# Patient Record
Sex: Female | Born: 1985 | Race: White | Hispanic: No | Marital: Single | Smoking: Current every day smoker
Health system: Southern US, Community
[De-identification: ages and names within clinical notes are randomized; demographics above are authoritative.]

## PROBLEM LIST (undated history)

## (undated) DIAGNOSIS — F329 Major depressive disorder, single episode, unspecified: Secondary | ICD-10-CM

## (undated) DIAGNOSIS — F419 Anxiety disorder, unspecified: Secondary | ICD-10-CM

## (undated) DIAGNOSIS — I10 Essential (primary) hypertension: Secondary | ICD-10-CM

## (undated) DIAGNOSIS — F32A Depression, unspecified: Secondary | ICD-10-CM

## (undated) HISTORY — PX: KNEE SURGERY: SHX244

## (undated) HISTORY — PX: CHOLECYSTECTOMY: SHX55

---

## 2016-09-11 ENCOUNTER — Encounter (HOSPITAL_BASED_OUTPATIENT_CLINIC_OR_DEPARTMENT_OTHER): Payer: Self-pay | Admitting: *Deleted

## 2016-09-11 ENCOUNTER — Emergency Department (HOSPITAL_BASED_OUTPATIENT_CLINIC_OR_DEPARTMENT_OTHER)
Admission: EM | Admit: 2016-09-11 | Discharge: 2016-09-11 | Disposition: A | Discharge status: Home / Self Care | Payer: Medicaid Other | Attending: Emergency Medicine | Admitting: Emergency Medicine

## 2016-09-11 ENCOUNTER — Emergency Department (HOSPITAL_BASED_OUTPATIENT_CLINIC_OR_DEPARTMENT_OTHER): Payer: Medicaid Other

## 2016-09-11 DIAGNOSIS — F17210 Nicotine dependence, cigarettes, uncomplicated: Secondary | ICD-10-CM | POA: Diagnosis not present

## 2016-09-11 DIAGNOSIS — N8302 Follicular cyst of left ovary: Secondary | ICD-10-CM | POA: Diagnosis not present

## 2016-09-11 DIAGNOSIS — R1032 Left lower quadrant pain: Secondary | ICD-10-CM | POA: Insufficient documentation

## 2016-09-11 DIAGNOSIS — R110 Nausea: Secondary | ICD-10-CM | POA: Diagnosis not present

## 2016-09-11 DIAGNOSIS — N83202 Unspecified ovarian cyst, left side: Secondary | ICD-10-CM

## 2016-09-11 DIAGNOSIS — I10 Essential (primary) hypertension: Secondary | ICD-10-CM | POA: Insufficient documentation

## 2016-09-11 DIAGNOSIS — R102 Pelvic and perineal pain: Secondary | ICD-10-CM | POA: Diagnosis present

## 2016-09-11 HISTORY — DX: Essential (primary) hypertension: I10

## 2016-09-11 HISTORY — DX: Anxiety disorder, unspecified: F41.9

## 2016-09-11 HISTORY — DX: Depression, unspecified: F32.A

## 2016-09-11 HISTORY — DX: Major depressive disorder, single episode, unspecified: F32.9

## 2016-09-11 LAB — CBC WITH DIFFERENTIAL/PLATELET
Basophils Absolute: 0 10*3/uL (ref 0.0–0.1)
Basophils Relative: 0 %
EOS PCT: 1 %
Eosinophils Absolute: 0.2 10*3/uL (ref 0.0–0.7)
HCT: 38.5 % (ref 36.0–46.0)
Hemoglobin: 12.2 g/dL (ref 12.0–15.0)
LYMPHS ABS: 2.7 10*3/uL (ref 0.7–4.0)
LYMPHS PCT: 22 %
MCH: 30.3 pg (ref 26.0–34.0)
MCHC: 31.7 g/dL (ref 30.0–36.0)
MCV: 95.8 fL (ref 78.0–100.0)
MONO ABS: 1 10*3/uL (ref 0.1–1.0)
MONOS PCT: 8 %
Neutro Abs: 8.3 10*3/uL — ABNORMAL HIGH (ref 1.7–7.7)
Neutrophils Relative %: 69 %
PLATELETS: 275 10*3/uL (ref 150–400)
RBC: 4.02 MIL/uL (ref 3.87–5.11)
RDW: 18.9 % — AB (ref 11.5–15.5)
WBC: 12.2 10*3/uL — ABNORMAL HIGH (ref 4.0–10.5)

## 2016-09-11 LAB — WET PREP, GENITAL
Clue Cells Wet Prep HPF POC: NONE SEEN
Sperm: NONE SEEN
Trich, Wet Prep: NONE SEEN
YEAST WET PREP: NONE SEEN

## 2016-09-11 LAB — BASIC METABOLIC PANEL
ANION GAP: 8 (ref 5–15)
BUN: 11 mg/dL (ref 6–20)
CALCIUM: 8.9 mg/dL (ref 8.9–10.3)
CO2: 27 mmol/L (ref 22–32)
Chloride: 101 mmol/L (ref 101–111)
Creatinine, Ser: 0.82 mg/dL (ref 0.44–1.00)
GFR calc Af Amer: >60 mL/min (ref >60)
GLUCOSE: 107 mg/dL — AB (ref 65–99)
Potassium: 4 mmol/L (ref 3.5–5.1)
Sodium: 136 mmol/L (ref 135–145)

## 2016-09-11 LAB — URINALYSIS, ROUTINE W REFLEX MICROSCOPIC
GLUCOSE, UA: NEGATIVE mg/dL
HGB URINE DIPSTICK: NEGATIVE
Ketones, ur: 15 mg/dL — AB
Leukocytes, UA: NEGATIVE
Nitrite: NEGATIVE
Protein, ur: NEGATIVE mg/dL
SPECIFIC GRAVITY, URINE: 1.025 (ref 1.005–1.030)
pH: 6 (ref 5.0–8.0)

## 2016-09-11 LAB — PREGNANCY, URINE: PREG TEST UR: NEGATIVE

## 2016-09-11 MED ORDER — NAPROXEN 500 MG PO TABS: 500.0000 mg | ORAL_TABLET | Freq: Two times a day (BID) | ORAL | 0 refills | Status: AC

## 2016-09-11 MED ORDER — MORPHINE SULFATE (PF) 4 MG/ML IV SOLN
4.0000 mg | Freq: Once | INTRAVENOUS | Status: AC
  Administered 2016-09-11: 4 mg via INTRAVENOUS
  Filled 2016-09-11: qty 1

## 2016-09-11 MED ORDER — IOPAMIDOL (ISOVUE-300) INJECTION 61%
100.0000 mL | Freq: Once | INTRAVENOUS | Status: AC | PRN
Start: 2016-09-11 — End: 2016-09-11
  Administered 2016-09-11: 100 mL via INTRAVENOUS

## 2016-09-11 MED ORDER — SODIUM CHLORIDE 0.9 % IV SOLN
INTRAVENOUS | Status: DC
  Administered 2016-09-11: 17:00:00 via INTRAVENOUS

## 2016-09-11 NOTE — ED Notes (Signed)
ED Provider at bedside. 

## 2016-09-11 NOTE — ED Provider Notes (Signed)
[facility] Provider Note   CSN: [id number] Arrival date & time: 09/11/16  1507  By signing my name below, I, Amy Walker, attest that this documentation has been prepared under the direction and in the presence of Linwood Dibbles, MD. Electronically Signed: Karren Cobble, ED Scribe. 09/11/16. 4:56 PM.  History   Chief Complaint Chief Complaint  Patient presents with  . Pelvic Pain    The history is provided by the patient. No language interpreter was used.  Pelvic Pain  This is a new problem. The current episode started more than 2 days ago. The problem occurs constantly. The problem has been gradually worsening. Associated symptoms include abdominal pain. She has tried nothing for the symptoms.    HPI HPI Comments: Amy Walker is a 31 y.o. female who presents to the Emergency Department complaining of sudden onset, gradually worsening, persistent pelvic pain that began three days ago. She notes associated LLQ abdominal cramping and one episode of nausea today. Pt reports for the past three days she has been experiencing cramping to her LLQ. She reports having her LMP on 7/10, which was normal, and she has had NBM for the last two days. She endorses a PSHx of cholecystectomy. No treatment tried PTA. Denies fever, vomiting, diarrhea, vaginal bleeding or discharge, or dysuria.   Past Medical History:  Diagnosis Date  . Anxiety   . Depression   . Hypertension    There are no active problems to display for this patient.  Past Surgical History:  Procedure Laterality Date  . CESAREAN SECTION    . CHOLECYSTECTOMY    . KNEE SURGERY Bilateral    OB History    No data available     Home Medications    Prior to Admission medications   Medication Sig Start Date End Date Taking? Authorizing Provider  naproxen (NAPROSYN) 500 MG tablet Take 1 tablet (500 mg total) by mouth 2 (two) times daily with a meal. As needed for pain 09/11/16   Linwood Dibbles, MD   Family History No family  history on file.  Social History Social History  Substance Use Topics  . Smoking status: Current Every Day Smoker    Types: Cigars  . Smokeless tobacco: Never Used  . Alcohol use Yes     Comment: occ   Allergies   Patient has no known allergies.  Review of Systems Review of Systems  Constitutional: Negative for fever.  Gastrointestinal: Positive for abdominal pain and nausea. Negative for diarrhea and vomiting.  Genitourinary: Positive for pelvic pain. Negative for dysuria, vaginal bleeding and vaginal discharge.   Physical Exam Updated Vital Signs BP (!) 144/75   Pulse 86   Temp 98.2 F (36.8 C) (Oral)   Resp 16   Ht 1.651 m (5\' 5" )   Wt 93.9 kg (207 lb)   LMP 08/23/2016   SpO2 92%   BMI 34.45 kg/m   Physical Exam  Constitutional: She appears well-developed and well-nourished. No distress.  HENT:  Head: Normocephalic and atraumatic.  Right Ear: External ear normal.  Left Ear: External ear normal.  Eyes: Conjunctivae are normal. Right eye exhibits no discharge. Left eye exhibits no discharge. No scleral icterus.  Neck: Neck supple. No tracheal deviation present.  Cardiovascular: Normal rate, regular rhythm and intact distal pulses.   Pulmonary/Chest: Effort normal and breath sounds normal. No stridor. No respiratory distress. She has no wheezes. She has no rales.  Abdominal: Soft. Bowel sounds are normal. She exhibits no distension. There is tenderness  in the left lower quadrant. There is guarding. There is no rigidity and no rebound. No hernia.  Genitourinary: Vagina normal and uterus normal. Pelvic exam was performed with patient supine. There is no rash, tenderness or lesion on the right labia. There is no rash, tenderness or lesion on the left labia. Cervix exhibits no motion tenderness and no discharge. Right adnexum displays no mass, no tenderness and no fullness. Left adnexum displays tenderness. Left adnexum displays no mass and no fullness.  Musculoskeletal: She  exhibits no edema or tenderness.  Neurological: She is alert. She has normal strength. No cranial nerve deficit (no facial droop, extraocular movements intact, no slurred speech) or sensory deficit. She exhibits normal muscle tone. She displays no seizure activity. Coordination normal.  Skin: Skin is warm and dry. No rash noted.  Psychiatric: She has a normal mood and affect.  Nursing note and vitals reviewed.  ED Treatments / Results  DIAGNOSTIC STUDIES: Oxygen Saturation is 97% on RA, adequate by my interpretation.   COORDINATION OF CARE: 4:53 PM-Discussed next steps with pt. Pt verbalized understanding and is agreeable with the plan.   Labs (all labs ordered are listed, but only abnormal results are displayed) Labs Reviewed  WET PREP, GENITAL - Abnormal; Notable for the following:       Result Value   WBC, Wet Prep HPF POC FEW (*)    All other components within normal limits  URINALYSIS, ROUTINE W REFLEX MICROSCOPIC - Abnormal; Notable for the following:    APPearance CLOUDY (*)    Bilirubin Urine SMALL (*)    Ketones, ur 15 (*)    All other components within normal limits  CBC WITH DIFFERENTIAL/PLATELET - Abnormal; Notable for the following:    WBC 12.2 (*)    RDW 18.9 (*)    Neutro Abs 8.3 (*)    All other components within normal limits  BASIC METABOLIC PANEL - Abnormal; Notable for the following:    Glucose, Bld 107 (*)    All other components within normal limits  PREGNANCY, URINE  RPR  HIV ANTIBODY (ROUTINE TESTING)  GC/CHLAMYDIA PROBE AMP ([place]) NOT AT [facility]    Radiology US Transvaginal Non-ob  Result Date: 09/11/2016 CLINICAL DATA:  Left lower quadrant pain for 3 days. EXAM: TRANSABDOMINAL AND TRANSVAGINAL ULTRASOUND OF PELVIS TECHNIQUE: Both transabdominal and transvaginal ultrasound examinations of the pelvis were performed. Transabdominal technique was performed for global imaging of the pelvis including uterus, ovaries, adnexal regions, and pelvic  cul-de-sac. It was necessary to proceed with endovaginal exam following the transabdominal exam to visualize the endometrium and ovaries. COMPARISON:  None FINDINGS: Uterus Measurements: 9.3 x 5.3 x 6.1 cm. Small nabothian cysts in the cervix. Endometrium Thickness: 14 mm.  Mildly heterogeneous with no focal mass. Right ovary Not visualized. Left ovary Not visualized. Other findings No abnormal free fluid. IMPRESSION: 1. Neither ovary was visualized. No cause for the left lower quadrant pain identified. Electronically Signed   By: Gerome Sam III M.D   On: 09/11/2016 18:33   US Pelvis Complete  Result Date: 09/11/2016 CLINICAL DATA:  Left lower quadrant pain for 3 days. EXAM: TRANSABDOMINAL AND TRANSVAGINAL ULTRASOUND OF PELVIS TECHNIQUE: Both transabdominal and transvaginal ultrasound examinations of the pelvis were performed. Transabdominal technique was performed for global imaging of the pelvis including uterus, ovaries, adnexal regions, and pelvic cul-de-sac. It was necessary to proceed with endovaginal exam following the transabdominal exam to visualize the endometrium and ovaries. COMPARISON:  None FINDINGS: Uterus Measurements: 9.3 x  5.3 x 6.1 cm. Small nabothian cysts in the cervix. Endometrium Thickness: 14 mm.  Mildly heterogeneous with no focal mass. Right ovary Not visualized. Left ovary Not visualized. Other findings No abnormal free fluid. IMPRESSION: 1. Neither ovary was visualized. No cause for the left lower quadrant pain identified. Electronically Signed   By: Gerome Samavid  Williams III M.D   On: 09/11/2016 18:33   Ct Abdomen Pelvis W Contrast  Result Date: 09/11/2016 CLINICAL DATA:  31 year old female with left lower quadrant abdominal pain. EXAM: CT ABDOMEN AND PELVIS WITH CONTRAST TECHNIQUE: Multidetector CT imaging of the abdomen and pelvis was performed using the standard protocol following bolus administration of intravenous contrast. CONTRAST:  100mL ISOVUE-300 IOPAMIDOL (ISOVUE-300)  INJECTION 61% COMPARISON:  None. FINDINGS: Lower chest: Right lung base scarring noted. The visualized lung bases are otherwise clear. No intra-abdominal free air or free fluid. Hepatobiliary: There is diffuse fatty infiltration of the liver. No intrahepatic biliary ductal dilatation. Cholecystectomy. Pancreas: Unremarkable. No pancreatic ductal dilatation or surrounding inflammatory changes. Spleen: Normal in size without focal abnormality. Adrenals/Urinary Tract: There is a 7 mm indeterminate left adrenal nodule, likely an adenoma. The right adrenal gland is unremarkable. There multiple nonobstructing bilateral renal calculi measuring up to 4 mm in the superior pole of the left kidney. Punctate nonobstructing stone fragments may be present in the region of the ureteropelvic junctions bilaterally. There is no hydronephrosis on either side. The urinary bladder is only partially distended and appears grossly unremarkable. Stomach/Bowel: There is no evidence of bowel obstruction or active inflammation. Normal appendix Vascular/Lymphatic: No significant vascular findings are present. No enlarged abdominal or pelvic lymph nodes. Reproductive: The uterus is anteverted and grossly unremarkable. There is a 2.5 cm left ovarian dominant follicle or cyst. There is mild haziness of the fat adjacent to the left ovary which may represent mild inflammation. Ultrasound may provide better evaluation of the pelvic structures. The right ovary is unremarkable. Other: Small fat containing umbilical hernia. Musculoskeletal: No acute or significant osseous findings. IMPRESSION: 1. A 2.5 cm left ovarian dominant follicle/cyst. Ultrasound may provide better evaluation of the pelvic structures. 2. Multiple nonobstructing bilateral renal calculi. No hydronephrosis. 3. Fatty liver. 4. No bowel obstruction or active inflammation.  Normal appendix. Electronically Signed   By: Elgie CollardArash  Radparvar M.D.   On: 09/11/2016 20:36     Procedures Procedures (including critical care time)  Medications Ordered in ED Medications  0.9 %  sodium chloride infusion ( Intravenous New Bag/Given 09/11/16 1659)  morphine 4 MG/ML injection 4 mg (4 mg Intravenous Given 09/11/16 1701)  morphine 4 MG/ML injection 4 mg (4 mg Intravenous Given 09/11/16 1932)  iopamidol (ISOVUE-300) 61 % injection 100 mL (100 mLs Intravenous Contrast Given 09/11/16 1938)    Initial Impression / Assessment and Plan / ED Course  I have reviewed the triage vital signs and the nursing notes.  Pertinent labs & imaging results that were available during my care of the patient were reviewed by me and considered in my medical decision making (see chart for details).  Clinical Course as of Sep 11 2112  Sun Sep 11, 2016  1746 Left adnexal ttp on pelvic exam.   Will us to evaluate for torsion , mass , cyst  [JK]  1907 Ovaries not visualized on us.  Will ct to evaluate further  [JK]    Clinical Course User Index [JK] Linwood DibblesKnapp, Julene Rahn, MD    CT scan demonstrates a l ovarian cyst not noted on US.  Pt sx improved with  treatment.  I doubt torsion at this point considering that her pain has resolved and she is more comfortable. Unfortunately us was not able to visualize the ovaries and confirm flow.  Will dc home on nsaids.  Discussed outpatient follow up with ob gyn  Final Clinical Impressions(s) / ED Diagnoses   Final diagnoses:  Cyst of left ovary    New Prescriptions New Prescriptions   NAPROXEN (NAPROSYN) 500 MG TABLET    Take 1 tablet (500 mg total) by mouth 2 (two) times daily with a meal. As needed for pain  I personally performed the services described in this documentation, which was scribed in my presence.  The recorded information has been reviewed and is accurate.    Linwood DibblesKnapp, Jesiah Yerby, MD 09/11/16 2116

## 2016-09-11 NOTE — ED Triage Notes (Signed)
Pt reports pelvic pain x3days. Denies fever, abnormal vaginal discharge/bleeding. Denies urinary symptoms, v/d. Reports mild nausea.

## 2016-09-11 NOTE — ED Notes (Signed)
Alert, NAD, calm, using phone, VSS.

## 2016-09-11 NOTE — ED Notes (Signed)
EDP into see 

## 2016-09-11 NOTE — ED Notes (Signed)
Alert, NAD, calm, interactive, resps e/u, speaking in clear complete sentences, no dyspnea noted, skin W&D, VSS, BP elevated, (denies: sob, nausea, dizziness or visual changes). To CT by stretcher.

## 2016-09-11 NOTE — ED Notes (Signed)
Back from CT, no changes.  

## 2016-09-12 LAB — GC/CHLAMYDIA PROBE AMP (~~LOC~~) NOT AT ARMC
Chlamydia: NEGATIVE
NEISSERIA GONORRHEA: NEGATIVE

## 2016-09-13 LAB — RPR: RPR Ser Ql: NONREACTIVE

## 2016-09-13 LAB — HIV ANTIBODY (ROUTINE TESTING W REFLEX): HIV Screen 4th Generation wRfx: NONREACTIVE

## 2020-08-05 ENCOUNTER — Emergency Department (HOSPITAL_BASED_OUTPATIENT_CLINIC_OR_DEPARTMENT_OTHER): Payer: Self-pay

## 2020-08-05 ENCOUNTER — Emergency Department (HOSPITAL_BASED_OUTPATIENT_CLINIC_OR_DEPARTMENT_OTHER)
Admission: EM | Admit: 2020-08-05 | Discharge: 2020-08-06 | Disposition: A | Discharge status: Home / Self Care | Payer: Self-pay | Attending: Emergency Medicine | Admitting: Emergency Medicine

## 2020-08-05 ENCOUNTER — Other Ambulatory Visit: Payer: Self-pay

## 2020-08-05 ENCOUNTER — Encounter (HOSPITAL_BASED_OUTPATIENT_CLINIC_OR_DEPARTMENT_OTHER): Payer: Self-pay

## 2020-08-05 DIAGNOSIS — R0789 Other chest pain: Secondary | ICD-10-CM | POA: Insufficient documentation

## 2020-08-05 DIAGNOSIS — R1084 Generalized abdominal pain: Secondary | ICD-10-CM | POA: Insufficient documentation

## 2020-08-05 DIAGNOSIS — I10 Essential (primary) hypertension: Secondary | ICD-10-CM | POA: Insufficient documentation

## 2020-08-05 DIAGNOSIS — R1013 Epigastric pain: Secondary | ICD-10-CM | POA: Insufficient documentation

## 2020-08-05 DIAGNOSIS — R112 Nausea with vomiting, unspecified: Secondary | ICD-10-CM | POA: Insufficient documentation

## 2020-08-05 DIAGNOSIS — F17210 Nicotine dependence, cigarettes, uncomplicated: Secondary | ICD-10-CM | POA: Insufficient documentation

## 2020-08-05 DIAGNOSIS — N10 Acute tubulo-interstitial nephritis: Secondary | ICD-10-CM

## 2020-08-05 LAB — URINALYSIS, ROUTINE W REFLEX MICROSCOPIC
Bilirubin Urine: NEGATIVE
Glucose, UA: NEGATIVE mg/dL
Hgb urine dipstick: NEGATIVE
Ketones, ur: 15 mg/dL — AB
Leukocytes,Ua: NEGATIVE
Nitrite: POSITIVE — AB
Protein, ur: NEGATIVE mg/dL
Specific Gravity, Urine: 1.015 (ref 1.005–1.030)
pH: 8 (ref 5.0–8.0)

## 2020-08-05 LAB — COMPREHENSIVE METABOLIC PANEL
ALT: 14 U/L (ref 0–44)
AST: 15 U/L (ref 15–41)
Albumin: 3.8 g/dL (ref 3.5–5.0)
Alkaline Phosphatase: 68 U/L (ref 38–126)
Anion gap: 9 (ref 5–15)
BUN: 14 mg/dL (ref 6–20)
CO2: 23 mmol/L (ref 22–32)
Calcium: 8.9 mg/dL (ref 8.9–10.3)
Chloride: 104 mmol/L (ref 98–111)
Creatinine, Ser: 0.71 mg/dL (ref 0.44–1.00)
GFR, Estimated: >60 mL/min (ref >60)
Glucose, Bld: 135 mg/dL — ABNORMAL HIGH (ref 70–99)
Potassium: 3.5 mmol/L (ref 3.5–5.1)
Sodium: 136 mmol/L (ref 135–145)
Total Bilirubin: 0.5 mg/dL (ref 0.3–1.2)
Total Protein: 7 g/dL (ref 6.5–8.1)

## 2020-08-05 LAB — CBC WITH DIFFERENTIAL/PLATELET
Abs Immature Granulocytes: 0.09 10*3/uL — ABNORMAL HIGH (ref 0.00–0.07)
Basophils Absolute: 0.1 10*3/uL (ref 0.0–0.1)
Basophils Relative: 0 %
Eosinophils Absolute: 0 10*3/uL (ref 0.0–0.5)
Eosinophils Relative: 0 %
HCT: 36 % (ref 36.0–46.0)
Hemoglobin: 11.1 g/dL — ABNORMAL LOW (ref 12.0–15.0)
Immature Granulocytes: 1 %
Lymphocytes Relative: 7 %
Lymphs Abs: 1.1 10*3/uL (ref 0.7–4.0)
MCH: 25.3 pg — ABNORMAL LOW (ref 26.0–34.0)
MCHC: 30.8 g/dL (ref 30.0–36.0)
MCV: 82 fL (ref 80.0–100.0)
Monocytes Absolute: 0.2 10*3/uL (ref 0.1–1.0)
Monocytes Relative: 2 %
Neutro Abs: 14.6 10*3/uL — ABNORMAL HIGH (ref 1.7–7.7)
Neutrophils Relative %: 90 %
Platelets: 392 10*3/uL (ref 150–400)
RBC: 4.39 MIL/uL (ref 3.87–5.11)
RDW: 20.4 % — ABNORMAL HIGH (ref 11.5–15.5)
WBC: 16.1 10*3/uL — ABNORMAL HIGH (ref 4.0–10.5)
nRBC: 0 % (ref 0.0–0.2)

## 2020-08-05 LAB — RAPID URINE DRUG SCREEN, HOSP PERFORMED
Amphetamines: NOT DETECTED
Barbiturates: NOT DETECTED
Benzodiazepines: NOT DETECTED
Cocaine: POSITIVE — AB
Opiates: POSITIVE — AB
Tetrahydrocannabinol: POSITIVE — AB

## 2020-08-05 LAB — URINALYSIS, MICROSCOPIC (REFLEX)

## 2020-08-05 LAB — HCG, SERUM, QUALITATIVE: Preg, Serum: NEGATIVE

## 2020-08-05 LAB — TROPONIN I (HIGH SENSITIVITY): Troponin I (High Sensitivity): 3 ng/L (ref <18)

## 2020-08-05 MED ORDER — SODIUM CHLORIDE 0.9 % IV BOLUS
1000.0000 mL | Freq: Once | INTRAVENOUS | Status: AC
  Administered 2020-08-05: 1000 mL via INTRAVENOUS

## 2020-08-05 MED ORDER — HALOPERIDOL LACTATE 5 MG/ML IJ SOLN
5.0000 mg | Freq: Once | INTRAMUSCULAR | Status: AC
  Administered 2020-08-05: 5 mg via INTRAVENOUS
  Filled 2020-08-05: qty 1

## 2020-08-05 MED ORDER — ONDANSETRON HCL 4 MG/2ML IJ SOLN
4.0000 mg | Freq: Once | INTRAMUSCULAR | Status: AC
  Administered 2020-08-05: 4 mg via INTRAVENOUS
  Filled 2020-08-05: qty 2

## 2020-08-05 MED ORDER — SODIUM CHLORIDE 0.9 % IV SOLN
1.0000 g | Freq: Once | INTRAVENOUS | Status: AC
  Administered 2020-08-06: 1 g via INTRAVENOUS
  Filled 2020-08-05: qty 10

## 2020-08-05 NOTE — ED Triage Notes (Signed)
Pt c/o CP x 2 hours-n/v x 3 hours-to tx area via w/c-hyperventilating-encouraged to take slow deep breaths

## 2020-08-05 NOTE — ED Provider Notes (Signed)
[facility] EMERGENCY DEPARTMENT Provider Note   CSN: [id number] Arrival date & time: 08/05/20  2030     History Chief Complaint  Patient presents with   Chest Pain   Abdominal Pain    Amy Walker is a 35 y.o. female.  The history is provided by the patient.  Chest Pain Pain location:  Epigastric Pain quality: burning   Pain radiates to:  Does not radiate Pain severity:  Mild Onset quality:  Gradual Timing:  Constant Progression:  Unchanged Chronicity:  New Relieved by:  Nothing Worsened by:  Nothing Associated symptoms: abdominal pain, nausea and vomiting   Associated symptoms: no back pain, no cough, no fever, no palpitations and no shortness of breath   Risk factors: hypertension and smoking (THC use)   Risk factors: no coronary artery disease, no diabetes mellitus, no high cholesterol and no prior DVT/PE       Past Medical History:  Diagnosis Date   Anxiety    Depression    Hypertension     There are no problems to display for this patient.   Past Surgical History:  Procedure Laterality Date   CESAREAN SECTION     CHOLECYSTECTOMY     KNEE SURGERY Bilateral      OB History   No obstetric history on file.     No family history on file.  Social History   Tobacco Use   Smoking status: Every Day    Pack years: 0.00    Types: Cigarettes   Smokeless tobacco: Never  Vaping Use   Vaping Use: Never used  Substance Use Topics   Alcohol use: Yes    Comment: daily   Drug use: Yes    Types: Marijuana    Home Medications Prior to Admission medications   Medication Sig Start Date End Date Taking? Authorizing Provider  naproxen (NAPROSYN) 500 MG tablet Take 1 tablet (500 mg total) by mouth 2 (two) times daily with a meal. As needed for pain 09/11/16   Linwood Dibbles, MD    Allergies    Patient has no known allergies.  Review of Systems   Review of Systems  Constitutional:  Negative for chills and fever.  HENT:  Negative for ear pain and  sore throat.   Eyes:  Negative for pain and visual disturbance.  Respiratory:  Negative for cough and shortness of breath.   Cardiovascular:  Positive for chest pain. Negative for palpitations.  Gastrointestinal:  Positive for abdominal pain, nausea and vomiting.  Genitourinary:  Negative for dysuria and hematuria.  Musculoskeletal:  Negative for arthralgias and back pain.  Skin:  Negative for color change and rash.  Neurological:  Negative for seizures and syncope.  All other systems reviewed and are negative.  Physical Exam Updated Vital Signs  ED Triage Vitals  Enc Vitals Group     BP 08/05/20 2041 (!) 185/77     Pulse Rate 08/05/20 2041 (!) 55     Resp 08/05/20 2041 (!) 28     Temp 08/05/20 2041 97.6 F (36.4 C)     Temp Source 08/05/20 2041 Oral     SpO2 08/05/20 2041 100 %     Weight 08/05/20 2040 200 lb (90.7 kg)     Height 08/05/20 2040 5\' 5"  (1.651 m)     Head Circumference --      Peak Flow --      Pain Score 08/05/20 2038 7     Pain Loc --  Pain Edu? --      Excl. in GC? --      Physical Exam Vitals and nursing note reviewed.  Constitutional:      General: She is in acute distress.     Appearance: She is well-developed. She is not ill-appearing.  HENT:     Head: Normocephalic and atraumatic.     Mouth/Throat:     Mouth: Mucous membranes are moist.  Eyes:     Extraocular Movements: Extraocular movements intact.     Conjunctiva/sclera: Conjunctivae normal.     Pupils: Pupils are equal, round, and reactive to light.  Cardiovascular:     Rate and Rhythm: Normal rate and regular rhythm.     Pulses: Normal pulses.          Radial pulses are 2+ on the right side and 2+ on the left side.     Heart sounds: Normal heart sounds. No murmur heard. Pulmonary:     Effort: Pulmonary effort is normal. No respiratory distress.     Breath sounds: Normal breath sounds. No decreased breath sounds or wheezing.  Abdominal:     Palpations: Abdomen is soft.      Tenderness: There is abdominal tenderness (diffuse).  Musculoskeletal:        General: Normal range of motion.     Cervical back: Normal range of motion and neck supple.     Right lower leg: No edema.     Left lower leg: No edema.  Skin:    General: Skin is warm and dry.     Capillary Refill: Capillary refill takes less than 2 seconds.  Neurological:     General: No focal deficit present.     Mental Status: She is alert.  Psychiatric:        Mood and Affect: Mood normal.    ED Results / Procedures / Treatments   Labs (all labs ordered are listed, but only abnormal results are displayed) Labs Reviewed  CBC WITH DIFFERENTIAL/PLATELET - Abnormal; Notable for the following components:      Result Value   WBC 16.1 (*)    Hemoglobin 11.1 (*)    MCH 25.3 (*)    RDW 20.4 (*)    Neutro Abs 14.6 (*)    Abs Immature Granulocytes 0.09 (*)    All other components within normal limits  COMPREHENSIVE METABOLIC PANEL - Abnormal; Notable for the following components:   Glucose, Bld 135 (*)    All other components within normal limits  HCG, SERUM, QUALITATIVE  URINALYSIS, ROUTINE W REFLEX MICROSCOPIC  TROPONIN I (HIGH SENSITIVITY)    EKG EKG Interpretation  Date/Time:  Wednesday August 05 2020 20:43:00 EDT Ventricular Rate:  54 PR Interval:  137 QRS Duration: 96 QT Interval:  419 QTC Calculation: 397 R Axis:   24 Text Interpretation: Sinus rhythm Confirmed by Virgina Norfolkuratolo, Andilynn Delavega ([id number]) on 08/05/2020 8:46:28 PM  Radiology CT ABDOMEN PELVIS WO CONTRAST  Result Date: 08/05/2020 CLINICAL DATA:  Nausea and vomiting for several hours with abdominal pain, initial encounter EXAM: CT ABDOMEN AND PELVIS WITHOUT CONTRAST TECHNIQUE: Multidetector CT imaging of the abdomen and pelvis was performed following the standard protocol without IV contrast. COMPARISON:  09/11/2016 FINDINGS: Lower chest: No acute abnormality. Mild scarring is noted in the bases bilaterally. Hepatobiliary: No focal liver  abnormality is seen. Status post cholecystectomy. No biliary dilatation. Pancreas: Unremarkable. No pancreatic ductal dilatation or surrounding inflammatory changes. Spleen: Normal in size without focal abnormality. Adrenals/Urinary Tract: Adrenal glands are within normal limits  bilaterally. The kidneys demonstrate bilateral small renal calculi measuring less than 5 mm. Left collecting system and ureter are within normal limits. Bladder is partially distended. Fullness of the right renal collecting system and right ureter is seen without obstructing calculus. These changes may be related to from a recently passed stone. Stomach/Bowel: No obstructive or inflammatory changes of the colon are seen. The appendix is well visualized and within normal limits. Small bowel and stomach are unremarkable. Vascular/Lymphatic: Aortic atherosclerosis. No enlarged abdominal or pelvic lymph nodes. Reproductive: Uterus and bilateral adnexa are unremarkable. Other: No abdominal wall hernia or abnormality. No abdominopelvic ascites. Musculoskeletal: No acute or significant osseous findings. IMPRESSION: Mild fullness of the right renal collecting system and right ureter is noted without obstructing calculus. This is likely related to edema from recently passed stone. Possibility of poorly calcified stone deserves consideration as well. Multiple renal calculi are noted bilaterally without obstructive change. Electronically Signed   By: Alcide Clever M.D.   On: 08/05/2020 22:28   DG Chest Portable 1 View  Result Date: 08/05/2020 CLINICAL DATA:  Chest pain EXAM: PORTABLE CHEST 1 VIEW COMPARISON:  12/09/2016 FINDINGS: The heart size and mediastinal contours are within normal limits. Both lungs are clear. The visualized skeletal structures are unremarkable. IMPRESSION: No active disease. Electronically Signed   By: Jasmine Pang M.D.   On: 08/05/2020 21:54    Procedures Procedures   Medications Ordered in ED Medications  sodium  chloride 0.9 % bolus 1,000 mL (0 mLs Intravenous Stopped 08/05/20 2225)  haloperidol lactate (HALDOL) injection 5 mg (5 mg Intravenous Given 08/05/20 2108)  ondansetron (ZOFRAN) injection 4 mg (4 mg Intravenous Given 08/05/20 2224)  sodium chloride 0.9 % bolus 1,000 mL (1,000 mLs Intravenous New Bag/Given 08/05/20 2225)    ED Course  I have reviewed the triage vital signs and the nursing notes.  Pertinent labs & imaging results that were available during my care of the patient were reviewed by me and considered in my medical decision making (see chart for details).    MDM Rules/Calculators/A&P                          Sharol Croghan is a 35 year old female with history of hypertension who presents the ED with nausea, vomiting, chest pain, abdominal pain.  Overall unremarkable vitals.  Symptoms started earlier this afternoon.  Does admit to marijuana use.  History of the same.  EKG shows sinus rhythm.  Troponin normal.  Overall doubt ACS.  Denies any urinary symptoms.  But awaiting to check urinalysis.  Does have a mild white count of 16.  Pregnancy test negative.  No significant anemia, electrolyte abnormality, kidney injury otherwise.  No significant elevation of liver enzymes or gallbladder enzymes.  Chest x-ray without signs of pneumonia.  Overall CT scan of the abdomen and pelvis showed findings may be consistent with recently passed kidney stone.  Overall history and physical do not seem consistent with kidney stone.  Overall patient appears to likely have hyperemesis from marijuana use.  Feeling much better after IV fluids and IV Haldol and IV Zofran.  Awaiting urinalysis and anticipate discharge to home plus or minus antibiotics and antinausea meds.  Handed off to oncoming ED staff with patient pending urinalysis.  This chart was dictated using voice recognition software.  Despite best efforts to proofread,  errors can occur which can change the documentation meaning.   Final Clinical  Impression(s) / ED Diagnoses Final diagnoses:  Generalized abdominal pain  Atypical chest pain    Rx / DC Orders ED Discharge Orders     None        Virgina Norfolk, DO 08/05/20 2245

## 2020-08-05 NOTE — ED Provider Notes (Addendum)
Care assumed from Dr. Lockie Mola.  Patient here with nausea, vomiting, abdominal pain.  Occurred after marijuana use.  Awaiting urinalysis.  Does have leukocytosis  of 16.  CT scan shows possibly passed kidney stone but did not have flank pain or urinary symptoms to suggest this.   Urinalysis with positive nitrate and bacteria.  We will culture and treat. UDS positive for opiates, cocaine, and THC.  Troponin negative x2.  Low suspicion for ACS.  D-dimer negative.  Nausea and vomiting suspected in setting of urinary tract infection and possibly marijuana use.  She does have leukocytosis and CT findings concerning for passed kidney stone.  No further vomiting in the ED.  Tolerating p.o.  Will treat with antibiotics and antiemetics.  Urine culture pending.  Follow-up with PCP as well as urology. Return to the ED with worsening pain, vomiting, fever, not able to urinate or any other concerns. Recommend cessation of illicit drug use.  BP (!) 172/85   Pulse 63   Temp 97.6 F (36.4 C) (Oral)   Resp 20   Ht 5\' 5"  (1.651 m)   Wt 90.7 kg   LMP 07/15/2020   SpO2 96%   BMI 33.28 kg/m     09/14/2020, MD 08/06/20 0145    08/08/20, MD 08/06/20 0145

## 2020-08-06 LAB — LIPASE, BLOOD: Lipase: 36 U/L (ref 11–51)

## 2020-08-06 LAB — D-DIMER, QUANTITATIVE: D-Dimer, Quant: <0.27 ug/mL-FEU (ref 0.00–0.50)

## 2020-08-06 LAB — TROPONIN I (HIGH SENSITIVITY): Troponin I (High Sensitivity): 4 ng/L (ref <18)

## 2020-08-06 MED ORDER — ONDANSETRON 4 MG PO TBDP: 4.0000 mg | ORAL_TABLET | Freq: Three times a day (TID) | ORAL | 0 refills | Status: AC | PRN

## 2020-08-06 MED ORDER — ONDANSETRON HCL 4 MG/2ML IJ SOLN
4.0000 mg | Freq: Once | INTRAMUSCULAR | Status: AC
  Administered 2020-08-06: 4 mg via INTRAVENOUS
  Filled 2020-08-06: qty 2

## 2020-08-06 MED ORDER — CEPHALEXIN 500 MG PO CAPS: 500.0000 mg | ORAL_CAPSULE | Freq: Three times a day (TID) | ORAL | 0 refills | Status: DC

## 2020-08-06 MED ORDER — DROPERIDOL 2.5 MG/ML IJ SOLN
2.5000 mg | Freq: Once | INTRAMUSCULAR | Status: AC
  Administered 2020-08-06: 2.5 mg via INTRAVENOUS
  Filled 2020-08-06: qty 2

## 2020-08-06 MED ORDER — SODIUM CHLORIDE 0.9 % IV SOLN
INTRAVENOUS | Status: DC | PRN
  Administered 2020-08-06: 1000 mL via INTRAVENOUS

## 2020-08-06 NOTE — Discharge Instructions (Addendum)
Take the antibiotics and nausea medication as prescribed.  As we discussed, you may have passed a kidney stone.  You should follow-up with the primary doctor as well as urologist.  Stop using cocaine and marijuana and other illicit drugs.  Follow-up with your primary doctor.  Return to the ED with worsening pain, fever, vomiting, not able to eat or drink or any other concerns.

## 2020-08-08 LAB — URINE CULTURE: Culture: >=100000 — AB

## 2020-08-09 ENCOUNTER — Telehealth: Payer: Self-pay | Admitting: Emergency Medicine

## 2020-08-09 NOTE — Telephone Encounter (Signed)
Post ED Visit - Positive Culture Follow-up  Culture report reviewed by antimicrobial stewardship pharmacist: [facility] []  , Pharm.D. []  Enzo Bi, .D., BCPS AQ-ID []  Celedonio Miyamoto, Pharm.D., BCPS []  [address], Pharm.D., BCPS []  [place], Garvin Fila.D., BCPS, AAHIVP []  , Pharm.D., BCPS, AAHIVP []  Georgina Pillion, PharmD, BCPS []  , PharmD, BCPS []  [place], PharmD, BCPS []  [address], PharmD []  , PharmD, BCPS [x]  Estella Husk, PharmD  Pharmacy Team []  Lysle Pearl, PharmD []  , PharmD []  Phillips Climes, PharmD []  , Rph []  Agapito Games) , PharmD []  Verlan Friends, PharmD []  , PharmD []  Mervyn Gay, PharmD []  , PharmD []  Vinnie Level, PharmD []  [facility], PharmD []  , PharmD []  Len Childs, PharmD   Positive urine culture Treated with Cephalexin, organism sensitive to the same and no further patient follow-up is required at this time.  Kynnedi Zweig 08/09/2020, 11:25 AM

## 2021-07-28 ENCOUNTER — Encounter (HOSPITAL_BASED_OUTPATIENT_CLINIC_OR_DEPARTMENT_OTHER): Payer: Self-pay

## 2021-07-28 ENCOUNTER — Other Ambulatory Visit (HOSPITAL_BASED_OUTPATIENT_CLINIC_OR_DEPARTMENT_OTHER): Payer: Self-pay

## 2021-07-28 ENCOUNTER — Emergency Department (HOSPITAL_BASED_OUTPATIENT_CLINIC_OR_DEPARTMENT_OTHER)
Admission: EM | Admit: 2021-07-28 | Discharge: 2021-07-28 | Disposition: A | Discharge status: Home / Self Care | Payer: BC Managed Care – PPO | Attending: Emergency Medicine | Admitting: Emergency Medicine

## 2021-07-28 ENCOUNTER — Other Ambulatory Visit: Payer: Self-pay

## 2021-07-28 DIAGNOSIS — R197 Diarrhea, unspecified: Secondary | ICD-10-CM | POA: Insufficient documentation

## 2021-07-28 DIAGNOSIS — N3000 Acute cystitis without hematuria: Secondary | ICD-10-CM | POA: Insufficient documentation

## 2021-07-28 DIAGNOSIS — R112 Nausea with vomiting, unspecified: Secondary | ICD-10-CM

## 2021-07-28 DIAGNOSIS — F17210 Nicotine dependence, cigarettes, uncomplicated: Secondary | ICD-10-CM | POA: Insufficient documentation

## 2021-07-28 DIAGNOSIS — I10 Essential (primary) hypertension: Secondary | ICD-10-CM | POA: Insufficient documentation

## 2021-07-28 DIAGNOSIS — F12288 Cannabis dependence with other cannabis-induced disorder: Secondary | ICD-10-CM | POA: Diagnosis not present

## 2021-07-28 DIAGNOSIS — R109 Unspecified abdominal pain: Secondary | ICD-10-CM | POA: Diagnosis present

## 2021-07-28 DIAGNOSIS — D72829 Elevated white blood cell count, unspecified: Secondary | ICD-10-CM | POA: Insufficient documentation

## 2021-07-28 DIAGNOSIS — F1490 Cocaine use, unspecified, uncomplicated: Secondary | ICD-10-CM

## 2021-07-28 DIAGNOSIS — R1084 Generalized abdominal pain: Secondary | ICD-10-CM

## 2021-07-28 LAB — CBC
HCT: 43.1 % (ref 36.0–46.0)
Hemoglobin: 13.2 g/dL (ref 12.0–15.0)
MCH: 27 pg (ref 26.0–34.0)
MCHC: 30.6 g/dL (ref 30.0–36.0)
MCV: 88.3 fL (ref 80.0–100.0)
Platelets: 340 10*3/uL (ref 150–400)
RBC: 4.88 MIL/uL (ref 3.87–5.11)
RDW: 19.5 % — ABNORMAL HIGH (ref 11.5–15.5)
WBC: 12.9 10*3/uL — ABNORMAL HIGH (ref 4.0–10.5)
nRBC: 0 % (ref 0.0–0.2)

## 2021-07-28 LAB — URINALYSIS, ROUTINE W REFLEX MICROSCOPIC
Bilirubin Urine: NEGATIVE
Glucose, UA: NEGATIVE mg/dL
Hgb urine dipstick: NEGATIVE
Ketones, ur: 15 mg/dL — AB
Leukocytes,Ua: NEGATIVE
Nitrite: POSITIVE — AB
Protein, ur: NEGATIVE mg/dL
Specific Gravity, Urine: 1.02 (ref 1.005–1.030)
pH: 8.5 — ABNORMAL HIGH (ref 5.0–8.0)

## 2021-07-28 LAB — LIPASE, BLOOD: Lipase: 33 U/L (ref 11–51)

## 2021-07-28 LAB — COMPREHENSIVE METABOLIC PANEL
ALT: 17 U/L (ref 0–44)
AST: 18 U/L (ref 15–41)
Albumin: 4.1 g/dL (ref 3.5–5.0)
Alkaline Phosphatase: 67 U/L (ref 38–126)
Anion gap: 9 (ref 5–15)
BUN: 10 mg/dL (ref 6–20)
CO2: 26 mmol/L (ref 22–32)
Calcium: 9.4 mg/dL (ref 8.9–10.3)
Chloride: 104 mmol/L (ref 98–111)
Creatinine, Ser: 0.74 mg/dL (ref 0.44–1.00)
GFR, Estimated: >60 mL/min (ref >60)
Glucose, Bld: 134 mg/dL — ABNORMAL HIGH (ref 70–99)
Potassium: 3.9 mmol/L (ref 3.5–5.1)
Sodium: 139 mmol/L (ref 135–145)
Total Bilirubin: 0.6 mg/dL (ref 0.3–1.2)
Total Protein: 7.8 g/dL (ref 6.5–8.1)

## 2021-07-28 LAB — RAPID URINE DRUG SCREEN, HOSP PERFORMED
Amphetamines: NOT DETECTED
Barbiturates: NOT DETECTED
Benzodiazepines: NOT DETECTED
Cocaine: POSITIVE — AB
Opiates: POSITIVE — AB
Tetrahydrocannabinol: POSITIVE — AB

## 2021-07-28 LAB — URINALYSIS, MICROSCOPIC (REFLEX)

## 2021-07-28 LAB — HCG, SERUM, QUALITATIVE: Preg, Serum: NEGATIVE

## 2021-07-28 MED ORDER — ZIKS ARTHRITIS PAIN RELIEF 0.025-1-12 % EX CREA
1.0000 | TOPICAL_CREAM | Freq: Three times a day (TID) | CUTANEOUS | 0 refills | Status: AC | PRN
  Filled 2021-07-28: qty 56.6, fill #0

## 2021-07-28 MED ORDER — SODIUM CHLORIDE 0.9 % IV BOLUS
1000.0000 mL | Freq: Once | INTRAVENOUS | Status: AC
  Administered 2021-07-28: 1000 mL via INTRAVENOUS

## 2021-07-28 MED ORDER — CEPHALEXIN 500 MG PO CAPS
500.0000 mg | ORAL_CAPSULE | Freq: Three times a day (TID) | ORAL | 0 refills | Status: AC
  Filled 2021-07-28: qty 20, 7d supply, fill #0

## 2021-07-28 MED ORDER — DROPERIDOL 2.5 MG/ML IJ SOLN
2.5000 mg | Freq: Once | INTRAMUSCULAR | Status: AC
  Administered 2021-07-28: 2.5 mg via INTRAMUSCULAR
  Filled 2021-07-28: qty 2

## 2021-07-28 MED ORDER — FAMOTIDINE IN NACL 20-0.9 MG/50ML-% IV SOLN
20.0000 mg | Freq: Once | INTRAVENOUS | Status: AC
  Administered 2021-07-28: 20 mg via INTRAVENOUS
  Filled 2021-07-28: qty 50

## 2021-07-28 MED ORDER — ONDANSETRON 4 MG PO TBDP
4.0000 mg | ORAL_TABLET | Freq: Four times a day (QID) | ORAL | 0 refills | Status: AC | PRN
  Filled 2021-07-28: qty 5, 8d supply, fill #0

## 2021-07-28 MED ORDER — DICYCLOMINE HCL 10 MG PO CAPS
10.0000 mg | ORAL_CAPSULE | Freq: Once | ORAL | Status: AC
  Administered 2021-07-28: 10 mg via ORAL
  Filled 2021-07-28: qty 1

## 2021-07-28 MED ORDER — KETOROLAC TROMETHAMINE 15 MG/ML IJ SOLN
15.0000 mg | Freq: Once | INTRAMUSCULAR | Status: AC
Start: 2021-07-28 — End: 2021-07-28
  Administered 2021-07-28: 15 mg via INTRAVENOUS
  Filled 2021-07-28: qty 1

## 2021-07-28 MED ORDER — PROMETHAZINE HCL 25 MG RE SUPP
25.0000 mg | Freq: Four times a day (QID) | RECTAL | 0 refills | Status: AC | PRN
  Filled 2021-07-28: qty 6, 2d supply, fill #0

## 2021-07-28 NOTE — ED Provider Notes (Signed)
3:19 PM Patient signed out to me by previous ED physician. Pt is a 36 yo daily marijuana user presenting for cyclical vomiting. Improves with hot showers.   Meds: Given droperidol and IV fluids  Workup: No electrolyte deficiencies. UA demonstrates UTI.  Plan:  DC when able to tolerate PO   Physical Exam  BP (!) 166/79   Pulse 65   Temp 98.1 F (36.7 C)   Resp 17   Ht 5\' 5"  (1.651 m)   Wt 90.7 kg   SpO2 97%   BMI 33.28 kg/m   Physical Exam Vitals and nursing note reviewed.  Constitutional:      General: She is not in acute distress.    Appearance: She is well-developed.  HENT:     Head: Normocephalic and atraumatic.  Eyes:     Conjunctiva/sclera: Conjunctivae normal.  Cardiovascular:     Rate and Rhythm: Normal rate and regular rhythm.     Heart sounds: No murmur heard. Pulmonary:     Effort: Pulmonary effort is normal. No respiratory distress.     Breath sounds: Normal breath sounds.  Abdominal:     Palpations: Abdomen is soft.     Tenderness: There is no abdominal tenderness.  Musculoskeletal:        General: No swelling.     Cervical back: Neck supple.  Skin:    General: Skin is warm and dry.     Capillary Refill: Capillary refill takes less than 2 seconds.  Neurological:     Mental Status: She is alert.  Psychiatric:        Mood and Affect: Mood normal.     Procedures  Procedures  ED Course / MDM    Medical Decision Making Amount and/or Complexity of Data Reviewed Labs: ordered. ECG/medicine tests: ordered.  Risk OTC drugs. Prescription drug management.   Keflex given for urinary tract infection.  Patient symptoms likely secondary to cannabis hyperemesis syndrome.  I spent at least 3 minutes discussing the dangers of cocaine use and cannabis induced cyclical vomiting syndrome encouraged them to quit..   Patient in no distress and overall condition improved here in the ED. Detailed discussions were had with the patient regarding current  findings, and need for close f/u with PCP or on call doctor. The patient has been instructed to return immediately if the symptoms worsen in any way for re-evaluation. Patient verbalized understanding and is in agreement with current care plan. All questions answered prior to discharge.        Campbell Stall P, DO [id number]

## 2021-07-28 NOTE — ED Triage Notes (Signed)
Pt reports abdominal pain, nausea, vomiting, and diarrhea since last night.

## 2021-07-28 NOTE — ED Provider Notes (Signed)
[facility] EMERGENCY DEPARTMENT Provider Note   CSN: [id number] Arrival date & time: 07/28/21  1155     History  Chief Complaint  Patient presents with   Abdominal Pain   Nausea   Diarrhea    Amy Walker is a 36 y.o. female.  Patient as above with significant medical history as below, including polysubstance abuse, marijuana use daily, anxiety, depression, hypertension who presents to the ED with complaint of nausea, vomiting abdominal pain,?  Diarrhea.  Symptom onset earlier this morning, patient with daily marijuana use, symptoms improved with marijuana use.  Patient also drank some alcohol last night.  Similar symptoms around 6 months ago improved with antiemetics in the ER.  No fevers, chills, suspicious p.o. intake, recent travel or sick contacts, surgical history includes cholecystectomy and C-section.  No urinary complaints.  No vaginal bleeding or discharge.  No chest pain or dyspnea  Patient does have daily THC use, spouse reports that previously when she had a similar episode care team told her that it was essentially associated with her marijuana use.  Patient and spouse think this is not source of emesis because the marijuana makes her symptoms better.  Symptoms also improved with a hot shower   Past Medical History:  Diagnosis Date   Anxiety    Depression    Hypertension     Past Surgical History:  Procedure Laterality Date   CESAREAN SECTION     CHOLECYSTECTOMY     KNEE SURGERY Bilateral      The history is provided by the patient and the spouse. No language interpreter was used.  Abdominal Pain Associated symptoms: diarrhea, nausea and vomiting   Diarrhea Associated symptoms: abdominal pain and vomiting        Home Medications Prior to Admission medications   Medication Sig Start Date End Date Taking? Authorizing Provider  cephALEXin (KEFLEX) 500 MG capsule Take 1 capsule (500 mg total) by mouth 3 (three) times daily. 08/06/20   Rancour,  Jeannett Senior, MD  naproxen (NAPROSYN) 500 MG tablet Take 1 tablet (500 mg total) by mouth 2 (two) times daily with a meal. As needed for pain 09/11/16   Linwood Dibbles, MD  ondansetron (ZOFRAN ODT) 4 MG disintegrating tablet Take 1 tablet (4 mg total) by mouth every 8 (eight) hours as needed for nausea or vomiting. 08/06/20   Glynn Octave, MD      Allergies    Patient has no known allergies.    Review of Systems   Review of Systems  Unable to perform ROS: Other (Patient actively vomiting)  Gastrointestinal:  Positive for abdominal pain, diarrhea, nausea and vomiting.    Physical Exam Updated Vital Signs BP (!) 152/96   Pulse 67   Temp 98.1 F (36.7 C)   Resp 14   Ht 5\' 5"  (1.651 m)   Wt 90.7 kg   SpO2 99%   BMI 33.28 kg/m  Physical Exam Vitals and nursing note reviewed.  Constitutional:      General: She is not in acute distress.    Appearance: Normal appearance. She is well-developed. She is not ill-appearing.     Comments: Actively vomiting, retching  HENT:     Head: Normocephalic and atraumatic.     Right Ear: External ear normal.     Left Ear: External ear normal.     Nose: Nose normal.     Mouth/Throat:     Mouth: Mucous membranes are moist.  Eyes:     General: No scleral icterus.  Right eye: No discharge.        Left eye: No discharge.  Cardiovascular:     Rate and Rhythm: Normal rate and regular rhythm.     Pulses: Normal pulses.     Heart sounds: Normal heart sounds.  Pulmonary:     Effort: Pulmonary effort is normal. No respiratory distress.     Breath sounds: Normal breath sounds.  Abdominal:     General: Abdomen is flat.     Palpations: Abdomen is soft.     Tenderness: There is no abdominal tenderness. There is no guarding or rebound.  Musculoskeletal:        General: Normal range of motion.     Cervical back: Normal range of motion.     Right lower leg: No edema.     Left lower leg: No edema.  Skin:    General: Skin is warm and dry.     Capillary  Refill: Capillary refill takes less than 2 seconds.  Neurological:     Mental Status: She is alert and oriented to person, place, and time.     GCS: GCS eye subscore is 4. GCS verbal subscore is 5. GCS motor subscore is 6.  Psychiatric:        Mood and Affect: Mood normal.        Behavior: Behavior normal.     ED Results / Procedures / Treatments   Labs (all labs ordered are listed, but only abnormal results are displayed) Labs Reviewed  COMPREHENSIVE METABOLIC PANEL - Abnormal; Notable for the following components:      Result Value   Glucose, Bld 134 (*)    All other components within normal limits  CBC - Abnormal; Notable for the following components:   WBC 12.9 (*)    RDW 19.5 (*)    All other components within normal limits  LIPASE, BLOOD  URINALYSIS, ROUTINE W REFLEX MICROSCOPIC  PREGNANCY, URINE  HCG, SERUM, QUALITATIVE  RAPID URINE DRUG SCREEN, HOSP PERFORMED    EKG EKG Interpretation  Date/Time:  Wednesday July 28 2021 12:39:12 EDT Ventricular Rate:  65 PR Interval:  155 QRS Duration: 85 QT Interval:  406 QTC Calculation: 423 R Axis:   32 Text Interpretation: Sinus rhythm Consider left atrial enlargement Baseline wander in lead(s) III V1 Baseline wander no stemi similar to prior Confirmed by Tanda Rockers ([id number]) on 07/28/2021 12:45:35 PM  Radiology No results found.  Procedures Procedures    Medications Ordered in ED Medications  droperidol (INAPSINE) 2.5 MG/ML injection 2.5 mg (has no administration in time range)  famotidine (PEPCID) IVPB 20 mg premix (has no administration in time range)  sodium chloride 0.9 % bolus 1,000 mL (1,000 mLs Intravenous New Bag/Given 07/28/21 1241)    ED Course/ Medical Decision Making/ A&P                           Medical Decision Making Amount and/or Complexity of Data Reviewed Labs: ordered. ECG/medicine tests: ordered.  Risk OTC drugs. Prescription drug management.    CC: Nausea, vomiting, abdominal  pain  This patient presents to the Emergency Department for the above complaint. This involves an extensive number of treatment options and is a complaint that carries with it a high risk of complications and morbidity. Vital signs were reviewed. Serious etiologies considered.  Differential diagnosis includes but is not exclusive to acute cholecystitis, intrathoracic causes for epigastric abdominal pain, gastritis, duodenitis, pancreatitis, small bowel or large bowel  obstruction, abdominal aortic aneurysm, hernia, gastritis, etc.   Record review:  Previous records obtained and reviewed  Prior ED visits, prior labs and imaging, home medications  Additional history obtained from spouse at bedside  Medical and surgical history as noted above.   Work up as above, notable for:  Labs & imaging results that were available during my care of the patient were visualized by me and considered in my medical decision making.   Cardiac monitoring reviewed and interpreted personally which shows NSR   Labs reviewed, UA with ?infection with +nitrites. Also some ketonuria,  UDS pending at shift change  CBC with mild leukocytosis, favor stress response in setting of profuse emesis. She is not septic  ECG reviewed and is stable, reviewed prior to droperidol dosing  Management: Give IV fluids, droperidol, Pepcid  Reassessment:  Pt feeling much better after intervention. She has stopped vomiting after droperidol.   Admission was considered.   Pt signed out to incoming EDP pending UDS and PO challenge. Pt is HDS.                Social determinants of health include -  Counseled patient for approximately 3 minutes regarding smoking cessation, thc cessation. Discussed risks of smoking and how they applied and affected their visit here today. Patient not ready to quit at this time, however will follow up with their primary doctor when they are.   CPT code: 1610999406: intermediate counseling for  smoking cessation   Social History   Socioeconomic History   Marital status: Single    Spouse name: Not on file   Number of children: Not on file   Years of education: Not on file   Highest education level: Not on file  Occupational History   Not on file  Tobacco Use   Smoking status: Every Day    Types: Cigarettes   Smokeless tobacco: Never  Vaping Use   Vaping Use: Never used  Substance and Sexual Activity   Alcohol use: Yes    Comment: daily   Drug use: Yes    Types: Marijuana   Sexual activity: Yes    Birth control/protection: None  Other Topics Concern   Not on file  Social History Narrative   Not on file   Social Determinants of Health   Financial Resource Strain: Not on file  Food Insecurity: Not on file  Transportation Needs: Not on file  Physical Activity: Not on file  Stress: Not on file  Social Connections: Not on file  Intimate Partner Violence: Not on file      This chart was dictated using voice recognition software.  Despite best efforts to proofread,  errors can occur which can change the documentation meaning.         Final Clinical Impression(s) / ED Diagnoses Final diagnoses:  Generalized abdominal pain  Cannabinoid hyperemesis syndrome  Cocaine use  Acute cystitis without hematuria    Rx / DC Orders ED Discharge Orders     None         Sloan LeiterGray, Dyanne [place]orks A, DO 07/28/21 2050

## 2021-07-28 NOTE — Discharge Instructions (Addendum)
Please refrain from marijuana use  It was a pleasure caring for you today in the emergency department.  Please return to the emergency department for any worsening or worrisome symptoms.

## 2021-08-05 ENCOUNTER — Other Ambulatory Visit (HOSPITAL_BASED_OUTPATIENT_CLINIC_OR_DEPARTMENT_OTHER): Payer: Self-pay

## 2023-05-17 IMAGING — CT CT ABD-PELV W/O CM
2 of 4 series · 16 of 46 positions shown, 18 images · non-contrast
Comparison: 09/11/2016

CLINICAL DATA: Nausea and vomiting for several hours with abdominal
pain, initial encounter

EXAM:
CT ABDOMEN AND PELVIS WITHOUT CONTRAST
TECHNIQUE: Multidetector CT imaging of the abdomen and pelvis was performed
following the standard protocol without IV contrast.

[Series 2: axial st · axial · 0.98mm/px · z∈[+942,+1392]mm · 13 of 102 slices shown, 15 images]
[im 8/102  soft-tissue]
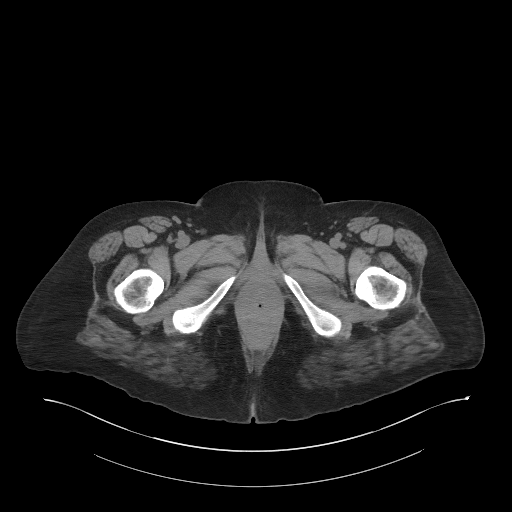
[im 8/102  bone]
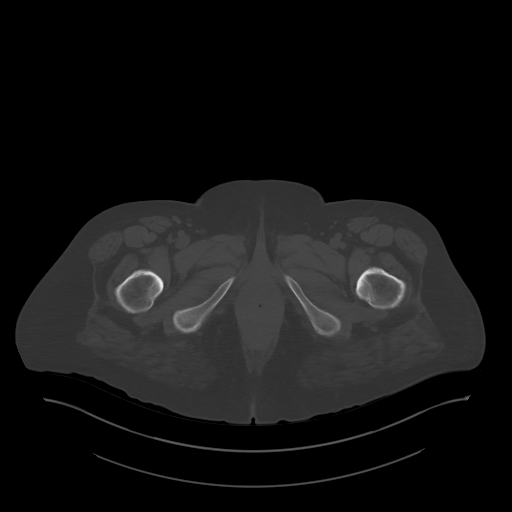
[im 15/102  soft-tissue]
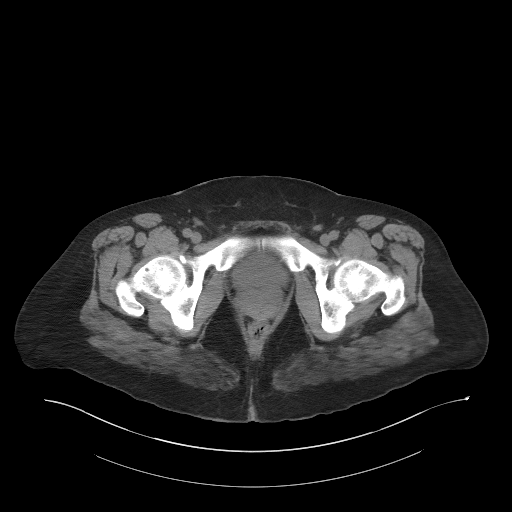
[im 23/102  soft-tissue]
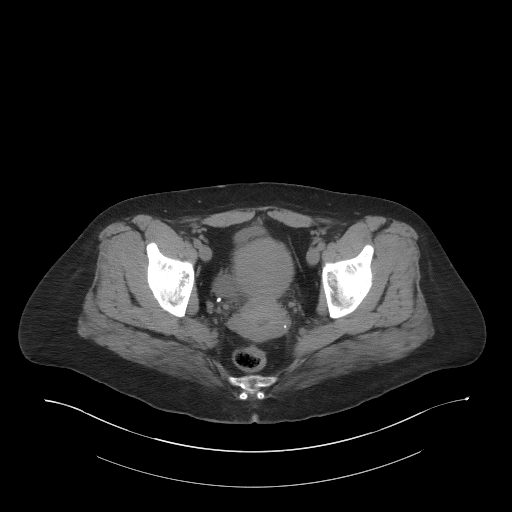
[im 30/102  soft-tissue]
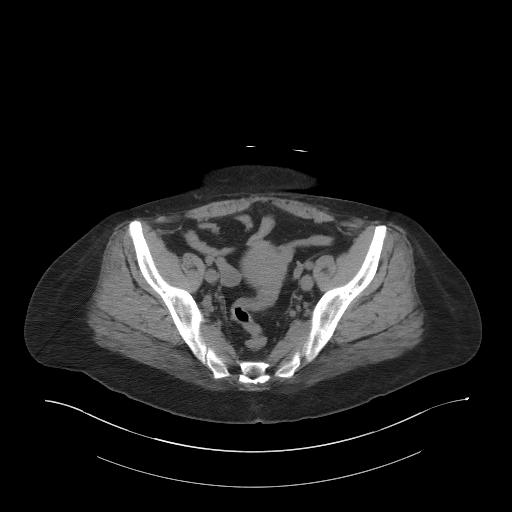
[im 38/102  soft-tissue]
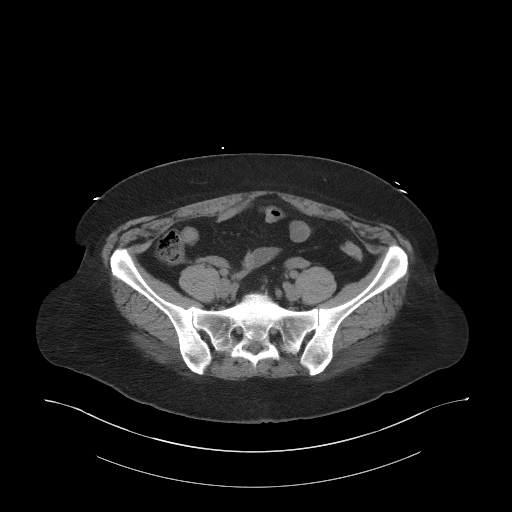
[im 45/102  soft-tissue]
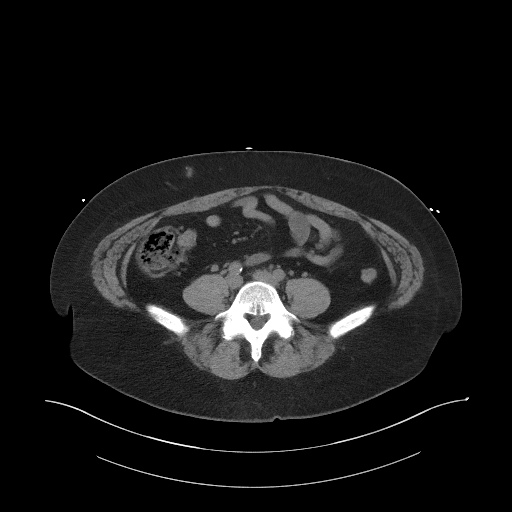
[im 53/102  soft-tissue]
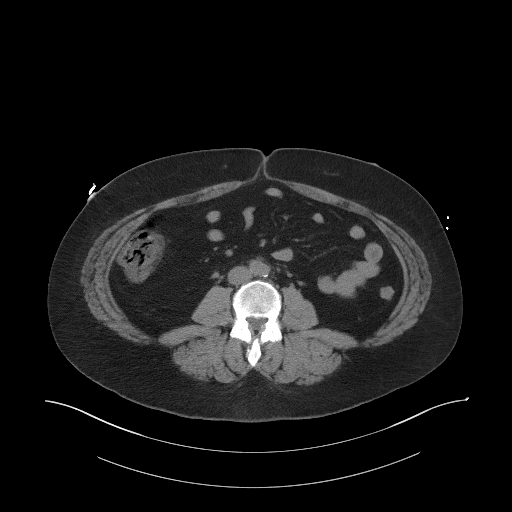
[im 60/102  soft-tissue]
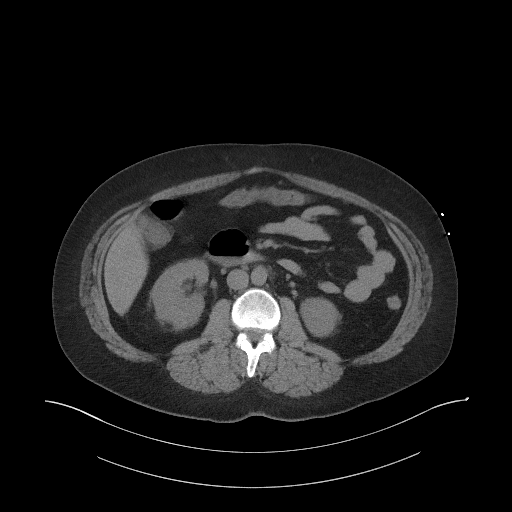
[im 68/102  soft-tissue]
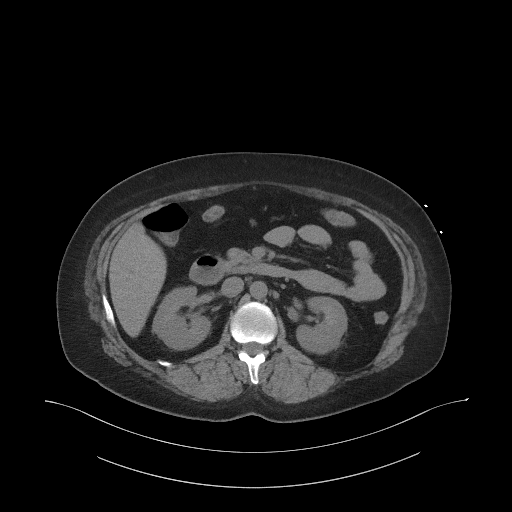
[im 68/102  bone]
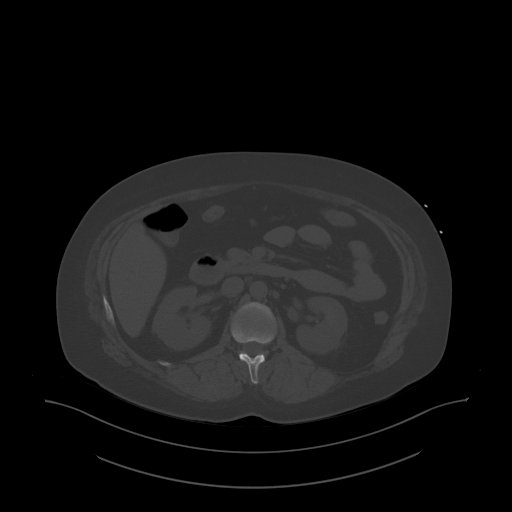
[im 75/102  soft-tissue]
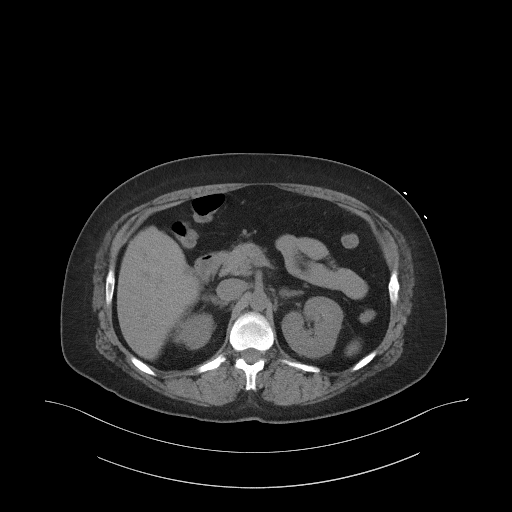
[im 83/102  soft-tissue]
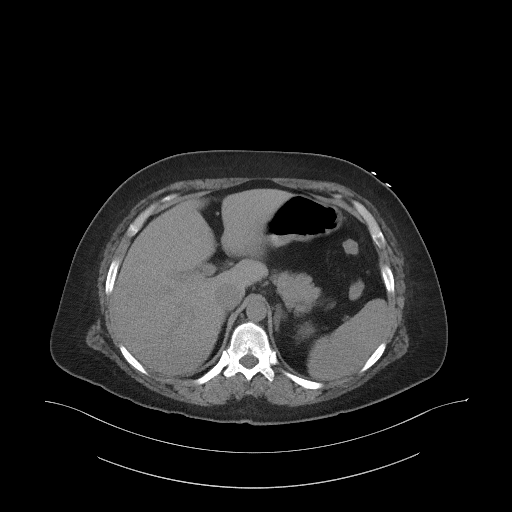
[im 90/102  soft-tissue]
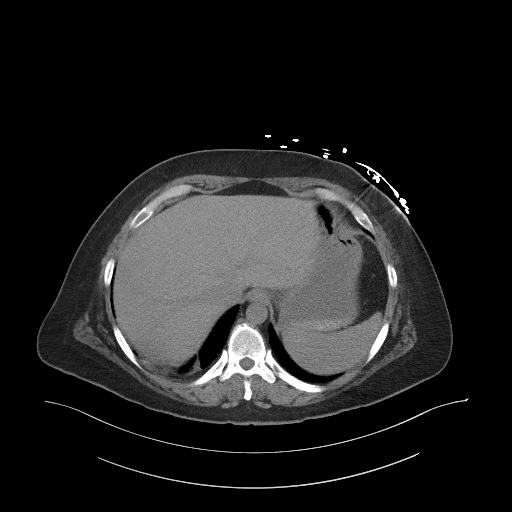
[im 98/102  soft-tissue]
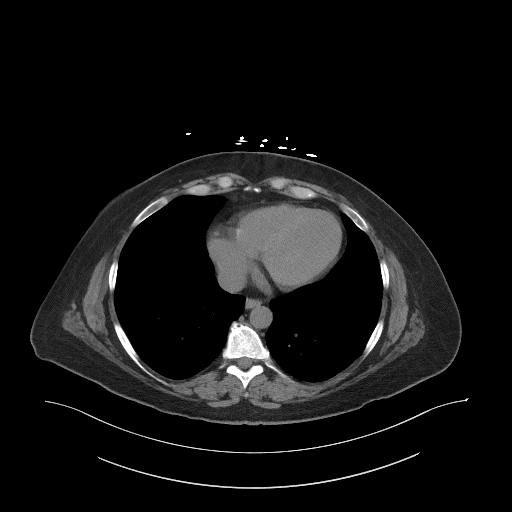

[Series 5: coronal st · coronal · 0.82mm/px · 3 of 85 slices shown]
[im 29/85  soft-tissue]
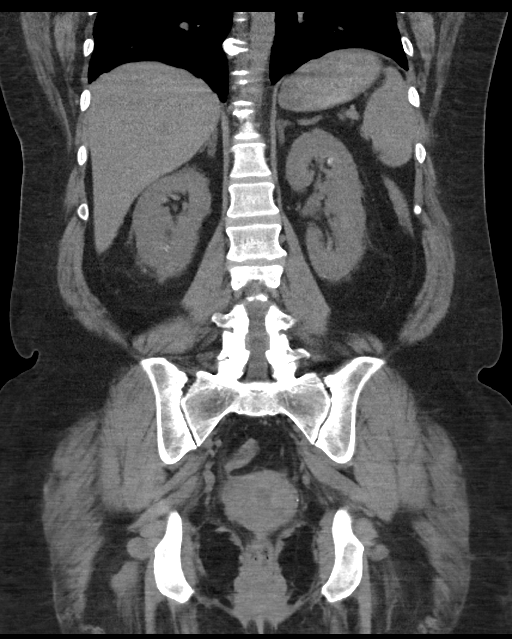
[im 38/85  soft-tissue]
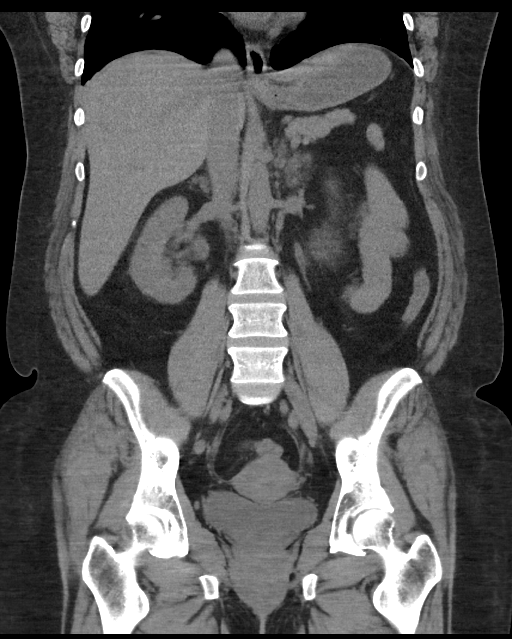
[im 47/85  soft-tissue]
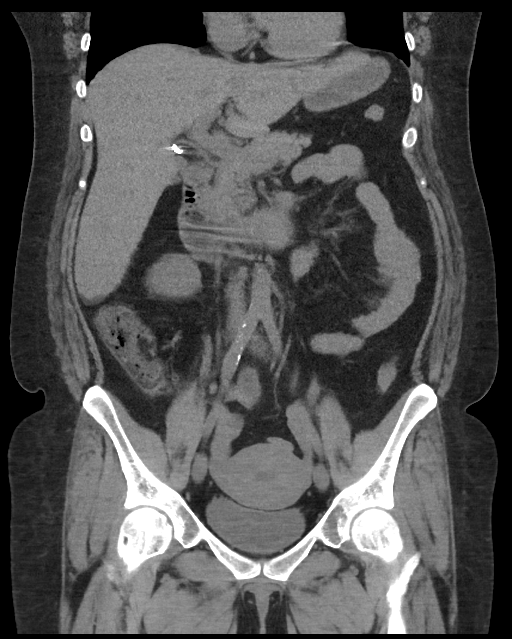

[16 of 46 positions shown; findings below may reference images not displayed]

FINDINGS: Lower chest: No acute abnormality. Mild scarring is noted in the
bases bilaterally.

Hepatobiliary: No focal liver abnormality is seen. Status post
cholecystectomy. No biliary dilatation.

Pancreas: Unremarkable. No pancreatic ductal dilatation or
surrounding inflammatory changes.

Spleen: Normal in size without focal abnormality.

Adrenals/Urinary Tract: Adrenal glands are within normal limits
bilaterally. The kidneys demonstrate bilateral small renal calculi
measuring less than 5 mm. Left collecting system and ureter are
within normal limits. Bladder is partially distended. Fullness of
the right renal collecting system and right ureter is seen without
obstructing calculus. These changes may be related to from a
recently passed stone.

Stomach/Bowel: No obstructive or inflammatory changes of the colon
are seen. The appendix is well visualized and within normal limits.
Small bowel and stomach are unremarkable.

Vascular/Lymphatic: Aortic atherosclerosis. No enlarged abdominal or
pelvic lymph nodes.

Reproductive: Uterus and bilateral adnexa are unremarkable.

Other: No abdominal wall hernia or abnormality. No abdominopelvic
ascites.

Musculoskeletal: No acute or significant osseous findings.
IMPRESSION: Mild fullness of the right renal collecting system and right ureter
is noted without obstructing calculus. This is likely related to
edema from recently passed stone. Possibility of poorly calcified
stone deserves consideration as well.

Multiple renal calculi are noted bilaterally without obstructive
change.

## 2023-05-17 IMAGING — DX DG CHEST 1V PORT
1 series · 1 of 1 positions shown · non-contrast
Comparison: 12/09/2016

CLINICAL DATA: Chest pain

EXAM:
PORTABLE CHEST 1 VIEW

[chest ap]
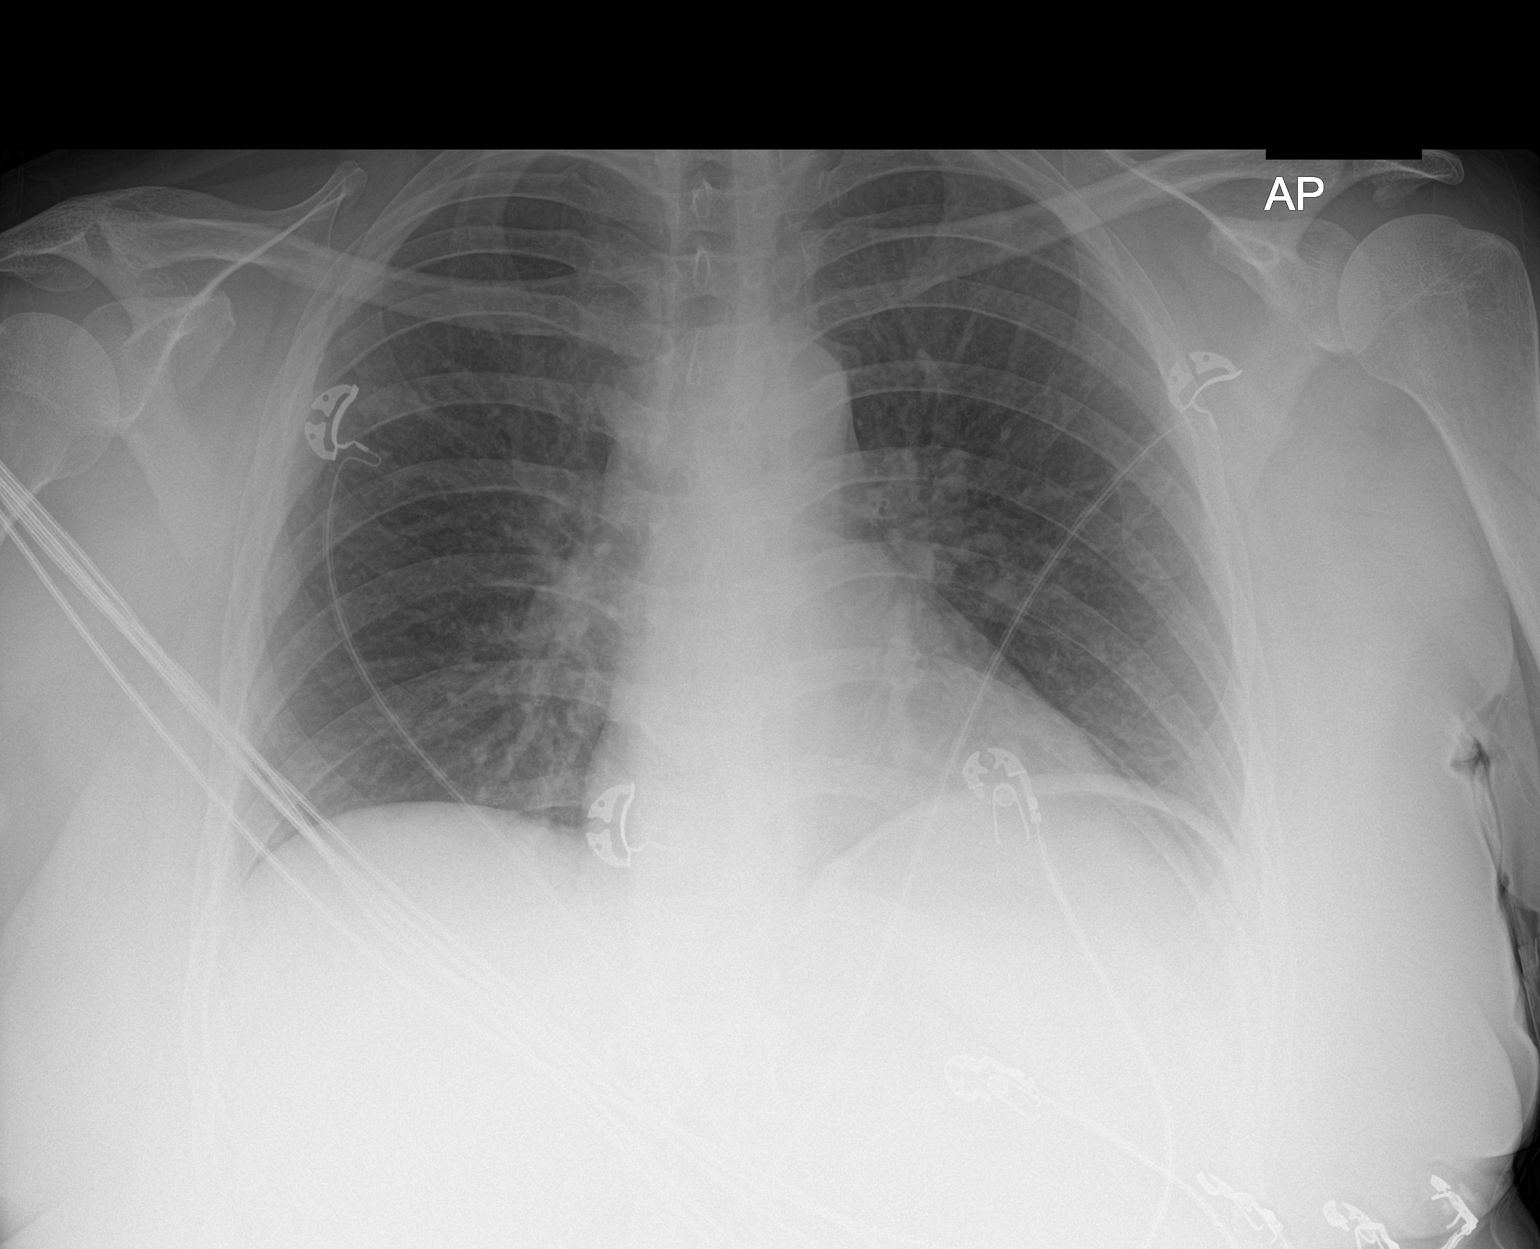

[1 of 1 positions shown; findings below may reference images not displayed]

FINDINGS: The heart size and mediastinal contours are within normal limits.
Both lungs are clear. The visualized skeletal structures are
unremarkable.
IMPRESSION: No active disease.
